# Patient Record
Sex: Female | Born: 2005 | Race: Black or African American | Hispanic: No
Health system: Southern US, Community
[De-identification: ages and names within clinical notes are randomized; demographics above are authoritative.]

## PROBLEM LIST (undated history)

## (undated) DIAGNOSIS — R5600 Simple febrile convulsions: Secondary | ICD-10-CM

## (undated) HISTORY — DX: Simple febrile convulsions: R56.00

---
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
## 2023-01-03 DIAGNOSIS — Z113 Encounter for screening for infections with a predominantly sexual mode of transmission: Secondary | ICD-10-CM | POA: Diagnosis not present

## 2023-01-10 DIAGNOSIS — A5400 Gonococcal infection of lower genitourinary tract, unspecified: Secondary | ICD-10-CM | POA: Diagnosis not present

## 2023-03-24 ENCOUNTER — Encounter (HOSPITAL_COMMUNITY): Payer: Self-pay | Admitting: Emergency Medicine

## 2023-03-24 ENCOUNTER — Emergency Department (HOSPITAL_COMMUNITY)
Admission: EM | Admit: 2023-03-24 | Discharge: 2023-03-25 | Disposition: A | Discharge status: Home / Self Care | Attending: Emergency Medicine | Admitting: Emergency Medicine

## 2023-03-24 DIAGNOSIS — R112 Nausea with vomiting, unspecified: Secondary | ICD-10-CM | POA: Diagnosis not present

## 2023-03-24 DIAGNOSIS — R197 Diarrhea, unspecified: Secondary | ICD-10-CM | POA: Insufficient documentation

## 2023-03-24 DIAGNOSIS — R1030 Lower abdominal pain, unspecified: Secondary | ICD-10-CM | POA: Diagnosis not present

## 2023-03-24 LAB — COMPREHENSIVE METABOLIC PANEL
ALT: 18 U/L (ref 0–44)
AST: 20 U/L (ref 15–41)
Albumin: 4.1 g/dL (ref 3.5–5.0)
Alkaline Phosphatase: 44 U/L (ref 38–126)
Anion gap: 10 (ref 5–15)
BUN: 10 mg/dL (ref 6–20)
CO2: 23 mmol/L (ref 22–32)
Calcium: 9.3 mg/dL (ref 8.9–10.3)
Chloride: 106 mmol/L (ref 98–111)
Creatinine, Ser: 0.9 mg/dL (ref 0.44–1.00)
GFR, Estimated: >60 mL/min (ref >60)
Glucose, Bld: 111 mg/dL — ABNORMAL HIGH (ref 70–99)
Potassium: 3.9 mmol/L (ref 3.5–5.1)
Sodium: 139 mmol/L (ref 135–145)
Total Bilirubin: 1 mg/dL (ref 0.0–1.2)
Total Protein: 7.4 g/dL (ref 6.5–8.1)

## 2023-03-24 LAB — CBC
HCT: 43 % (ref 36.0–46.0)
Hemoglobin: 14.2 g/dL (ref 12.0–15.0)
MCH: 29.3 pg (ref 26.0–34.0)
MCHC: 33 g/dL (ref 30.0–36.0)
MCV: 88.7 fL (ref 80.0–100.0)
Platelets: 204 10*3/uL (ref 150–400)
RBC: 4.85 MIL/uL (ref 3.87–5.11)
RDW: 12.9 % (ref 11.5–15.5)
WBC: 8.5 10*3/uL (ref 4.0–10.5)
nRBC: 0 % (ref 0.0–0.2)

## 2023-03-24 MED ORDER — ONDANSETRON 4 MG PO TBDP
4.0000 mg | ORAL_TABLET | Freq: Once | ORAL | Status: AC
  Administered 2023-03-24: 4 mg via ORAL
  Filled 2023-03-24: qty 1

## 2023-03-24 NOTE — ED Triage Notes (Signed)
 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

## 2023-03-25 LAB — URINALYSIS, ROUTINE W REFLEX MICROSCOPIC
Bilirubin Urine: NEGATIVE
Glucose, UA: NEGATIVE mg/dL
Hgb urine dipstick: NEGATIVE
Ketones, ur: 40 mg/dL — AB
Leukocytes,Ua: NEGATIVE
Nitrite: NEGATIVE
Protein, ur: NEGATIVE mg/dL
Specific Gravity, Urine: >1.03 — ABNORMAL HIGH (ref 1.005–1.030)
pH: 5.5 (ref 5.0–8.0)

## 2023-03-25 LAB — PREGNANCY, URINE: Preg Test, Ur: NEGATIVE

## 2023-03-25 LAB — LIPASE, BLOOD: Lipase: 25 U/L (ref 11–51)

## 2023-03-25 MED ORDER — LACTATED RINGERS IV BOLUS
1000.0000 mL | Freq: Once | INTRAVENOUS | Status: AC
  Administered 2023-03-25: 1000 mL via INTRAVENOUS

## 2023-03-25 MED ORDER — ONDANSETRON HCL 4 MG/2ML IJ SOLN
4.0000 mg | Freq: Once | INTRAMUSCULAR | Status: AC
  Administered 2023-03-25: 4 mg via INTRAVENOUS
  Filled 2023-03-25: qty 2

## 2023-03-25 MED ORDER — ONDANSETRON 4 MG PO TBDP: 4.0000 mg | ORAL_TABLET | Freq: Three times a day (TID) | ORAL | 0 refills | Status: DC | PRN

## 2023-03-25 NOTE — Discharge Instructions (Signed)
 Start with a clear liquid diet and advance slowly as tolerated.  Follow-up with your doctor.  Return to the ED for worsening abdominal pain, vomiting, not able to eat, not able to drink or other concerns

## 2023-03-25 NOTE — ED Provider Notes (Signed)
 [place] EMERGENCY DEPARTMENT AT [facility] Provider Note   CSN: [id number] Arrival date & time: 03/24/23  2243     History  Chief Complaint  Patient presents with   Emesis   Diarrhea    Faith Rios is a 18 y.o. female.  Patient with no past medical history here with abdominal pain, vomiting and diarrhea.  Symptoms onset about 5 PM the evening of March 2.  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  She reports about 4 episodes of yellowish vomit and yellowish stool.  Denies black or bloody stools.  Has lower abdominal pain at home but none currently.  No known fever.  No pain with urination or blood in the urine.  No vaginal bleeding or discharge.  No previous abdominal surgeries.  Has had sick contacts at work. States she has not had anything to eat or drink today at all even before prior to feeling ill at 5 PM.  Before 5 PM she felt relatively well but did not have any eat or drink today.  The history is provided by the patient.  Emesis Associated symptoms: abdominal pain and diarrhea   Associated symptoms: no arthralgias, no cough, no fever, no headaches and no myalgias   Diarrhea Associated symptoms: abdominal pain and vomiting   Associated symptoms: no arthralgias, no fever, no headaches and no myalgias        Home Medications Prior to Admission medications   Medication Sig Start Date End Date Taking? Authorizing Provider  ibuprofen (ADVIL) 200 MG tablet Take 200 mg by mouth every 6 (six) hours as needed for moderate pain.    [provider]  loratadine-pseudoephedrine (CLARITIN-D 12 HOUR) 5-120 MG tablet Take 1 tablet by mouth 2 (two) times daily. Patient not taking: Reported on 03/20/2021 11/17/18   Eustace Moore, MD  Vitamin D, Ergocalciferol, (DRISDOL) 50000 units CAPS capsule Take 1 capsule (50,000 Units total) by mouth every 7 (seven) days. Patient not taking: Reported on 03/20/2021 03/18/17   Annell Greening, MD       Allergies    Patient has no known allergies.    Review of Systems   Review of Systems  Constitutional:  Negative for activity change, appetite change and fever.  HENT:  Negative for congestion and rhinorrhea.   Respiratory:  Negative for cough, chest tightness and shortness of breath.   Cardiovascular:  Negative for chest pain.  Gastrointestinal:  Positive for abdominal pain, diarrhea and vomiting.  Genitourinary:  Negative for dysuria and hematuria.  Musculoskeletal:  Negative for arthralgias and myalgias.  Neurological:  Negative for dizziness, weakness and headaches.   all other systems are negative except as noted in the HPI and PMH.    Physical Exam Updated Vital Signs BP 131/80 (BP Location: Right Arm)   Pulse 77   Temp 98.2 F (36.8 C)   Resp 16   SpO2 100%  Physical Exam Vitals and nursing note reviewed.  Constitutional:      General: She is not in acute distress.    Appearance: She is well-developed.  HENT:     Head: Normocephalic and atraumatic.     Mouth/Throat:     Pharynx: No oropharyngeal exudate.  Eyes:     Conjunctiva/sclera: Conjunctivae normal.     Pupils: Pupils are equal, round, and reactive to light.  Neck:     Comments: No meningismus. Cardiovascular:     Rate and Rhythm: Normal rate and regular rhythm.     Heart  sounds: Normal heart sounds. No murmur heard. Pulmonary:     Effort: Pulmonary effort is normal. No respiratory distress.     Breath sounds: Normal breath sounds.  Abdominal:     Palpations: Abdomen is soft.     Tenderness: There is abdominal tenderness. There is no guarding or rebound.     Comments: Mild lower abdominal tenderness  Musculoskeletal:        General: No tenderness. Normal range of motion.     Cervical back: Normal range of motion and neck supple.  Skin:    General: Skin is warm.  Neurological:     Mental Status: She is alert and oriented to person, place, and time.     Cranial Nerves: No cranial nerve deficit.      Motor: No abnormal muscle tone.     Coordination: Coordination normal.     Comments:  5/5 strength throughout. CN 2-12 intact.Equal grip strength.   Psychiatric:        Behavior: Behavior normal.     ED Results / Procedures / Treatments   Labs (all labs ordered are listed, but only abnormal results are displayed) Labs Reviewed  COMPREHENSIVE METABOLIC PANEL - Abnormal; Notable for the following components:      Result Value   Glucose, Bld 111 (*)    All other components within normal limits  URINALYSIS, ROUTINE W REFLEX MICROSCOPIC - Abnormal; Notable for the following components:   Specific Gravity, Urine >1.030 (*)    Ketones, ur 40 (*)    All other components within normal limits  CBC  LIPASE, BLOOD  PREGNANCY, URINE  HCG, SERUM, QUALITATIVE    EKG None  Radiology No results found.  Procedures Procedures    Medications Ordered in ED Medications  lactated ringers bolus 1,000 mL (has no administration in time range)  ondansetron (ZOFRAN) injection 4 mg (has no administration in time range)  ondansetron (ZOFRAN-ODT) disintegrating tablet 4 mg (4 mg Oral Given 03/24/23 2307)    ED Course/ Medical Decision Making/ A&P                                 Medical Decision Making Amount and/or Complexity of Data Reviewed Labs: ordered. Decision-making details documented in ED Course. Radiology: ordered and independent interpretation performed. Decision-making details documented in ED Course. ECG/medicine tests: ordered and independent interpretation performed. Decision-making details documented in ED Course.  Risk Prescription drug management.   Nausea, vomiting, diarrhea, lower abdominal pain.  Multiple sick contacts at work.  Stable vitals.  Abdomen soft without peritoneal signs.  Will hydrate.  Low suspicion for surgical pathology such as appendicitis or cholecystitis.  hCG negative.  Urinalysis with ketones but negative for infection.  Labs are reassuring with  no significant leukocytosis.  No electrode abnormalities.  LFTs and lipase are normal.  Creatinine is normal  Suspect likely viral gastroenteritis.  Has had multiple sick contacts at work.  Abdomen is soft without peritoneal signs.  CT imaging offered but she declines at this time.  Low suspicion for appendicitis or cholecystitis.  Feels improved as her IV fluids and antiemetics.  Tolerating p.o.  Advised to start with clear liquid diet advance slowly as tolerated.  Will give course of antiemetics for home use. Abdomen remains soft without peritoneal signs and she continues to decline CT scan.  Follow with PCP.  Return to the ED with new or worsening symptoms.  Final Clinical Impression(s) / ED Diagnoses Final diagnoses:  None    Rx / DC Orders ED Discharge Orders     None         Erle Guster, Jeannett Senior, MD 03/25/23 [phone]

## 2023-07-19 DIAGNOSIS — S71122A Laceration with foreign body, left thigh, initial encounter: Secondary | ICD-10-CM | POA: Insufficient documentation

## 2023-07-19 DIAGNOSIS — Y92410 Unspecified street and highway as the place of occurrence of the external cause: Secondary | ICD-10-CM | POA: Diagnosis not present

## 2023-07-19 DIAGNOSIS — Z23 Encounter for immunization: Secondary | ICD-10-CM | POA: Diagnosis not present

## 2023-07-19 DIAGNOSIS — S70352A Superficial foreign body, left thigh, initial encounter: Secondary | ICD-10-CM | POA: Diagnosis not present

## 2023-07-20 ENCOUNTER — Emergency Department (HOSPITAL_COMMUNITY)

## 2023-07-20 ENCOUNTER — Other Ambulatory Visit: Payer: Self-pay

## 2023-07-20 ENCOUNTER — Emergency Department (HOSPITAL_COMMUNITY)
Admission: EM | Admit: 2023-07-20 | Discharge: 2023-07-20 | Disposition: A | Discharge status: Home / Self Care | Attending: Emergency Medicine | Admitting: Emergency Medicine

## 2023-07-20 ENCOUNTER — Encounter (HOSPITAL_COMMUNITY): Payer: Self-pay | Admitting: *Deleted

## 2023-07-20 DIAGNOSIS — S71112A Laceration without foreign body, left thigh, initial encounter: Secondary | ICD-10-CM

## 2023-07-20 DIAGNOSIS — S70352A Superficial foreign body, left thigh, initial encounter: Secondary | ICD-10-CM | POA: Diagnosis not present

## 2023-07-20 MED ORDER — LIDOCAINE-EPINEPHRINE-TETRACAINE (LET) TOPICAL GEL
3.0000 mL | Freq: Once | TOPICAL | Status: AC
  Administered 2023-07-20: 3 mL via TOPICAL
  Filled 2023-07-20: qty 3

## 2023-07-20 MED ORDER — TETANUS-DIPHTH-ACELL PERTUSSIS 5-2.5-18.5 LF-MCG/0.5 IM SUSY
0.5000 mL | PREFILLED_SYRINGE | Freq: Once | INTRAMUSCULAR | Status: AC
  Administered 2023-07-20: 0.5 mL via INTRAMUSCULAR
  Filled 2023-07-20: qty 0.5

## 2023-07-20 NOTE — ED Provider Notes (Signed)
.  Foreign Body Removal  Date/Time: 07/20/2023 10:14 AM  Performed by: Myriam Dorn BROCKS, PA Authorized by: Myriam Dorn BROCKS, PA  Consent: Verbal consent obtained Risks and benefits: risks, benefits and alternatives were discussed Consent given by: patient Patient understanding: patient states understanding of the procedure being performed Imaging studies: imaging studies available Patient identity confirmed: verbally with patient, arm band, provided demographic data and hospital-assigned identification number Body area: skin General location: lower extremity Location details: left hip  Anesthesia: Local Anesthetic: LET (lido,epi,tetracaine)  Sedation: Patient sedated: no  Patient restrained: no Patient cooperative: yes Localization method: serial x-rays Removal mechanism: forceps Dressing: antibiotic ointment and dressing applied Tendon involvement: none Depth: subcutaneous Complexity: simple 1 objects recovered. Objects recovered: Small wedge-shaped piece of glass from superficial skin. Post-procedure assessment: foreign body removed      Myriam Dorn BROCKS, PA 07/20/23 1017    Elnor Savant A, DO 07/21/23 [phone]

## 2023-07-20 NOTE — Discharge Instructions (Signed)
 It was a pleasure caring for you today in the emergency department.  Please keep wound clean and dry, if you develop pain, redness, swelling, drainage from the wound, fever, or any worsening or worrisome symptoms please come back to the ER. The dermabond/tissue adhesive should fall off in around 2 wks.
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
## 2023-08-21 ENCOUNTER — Other Ambulatory Visit: Payer: Self-pay

## 2023-08-21 ENCOUNTER — Encounter (HOSPITAL_COMMUNITY): Payer: Self-pay | Admitting: Obstetrics and Gynecology

## 2023-08-21 ENCOUNTER — Inpatient Hospital Stay (HOSPITAL_COMMUNITY)
Admission: AD | Admit: 2023-08-21 | Discharge: 2023-08-21 | Disposition: A | Discharge status: Home / Self Care | Attending: Obstetrics and Gynecology | Admitting: Obstetrics and Gynecology

## 2023-08-21 DIAGNOSIS — N926 Irregular menstruation, unspecified: Secondary | ICD-10-CM

## 2023-08-21 DIAGNOSIS — N912 Amenorrhea, unspecified: Secondary | ICD-10-CM | POA: Diagnosis present

## 2023-08-21 NOTE — MAU Note (Signed)
 Faith Rios is a 18 y.o. at Unknown here in MAU reporting: she missed her cycle and wants a pregnancy test.  Reports hasn't taken a HPT.  Denies current VB and pain  LMP: beginning of June Onset of complaint: today Pain score: 0 Vitals:   08/21/23 1310  BP: (!) 129/54  Pulse: 79  Resp: 19  Temp: 98.8 F (37.1 C)  SpO2: 99%     FHT: NA  Lab orders placed from triage: None

## 2023-08-21 NOTE — MAU Provider Note (Signed)
 Chief Complaint: Possible Pregnancy  SUBJECTIVE HPI: Faith Rios is a 18 y.o. G0P0 here for pregnancy test.  Patient missed her period this month. Unsure what day her last was except for it was in June. Not on anything for contraception. Has not taken a home pregnancy test. She denies vaginal bleeding, vaginal itching/burning, urinary symptoms, h/a, dizziness, n/v, abdominal pain, or fever/chills.    HPI  Past Medical History:  Diagnosis Date   Febrile seizure (HCC)    History reviewed. No pertinent surgical history. Social History   Socioeconomic History   Marital status: Unknown    Spouse name: Not on file   Number of children: Not on file   Years of education: Not on file   Highest education level: Not on file  Occupational History   Not on file  Tobacco Use   Smoking status: Never    Passive exposure: Never   Smokeless tobacco: Never  Substance and Sexual Activity   Alcohol use: Never   Drug use: Never   Sexual activity: Never  Other Topics Concern   Not on file  Social History Narrative   ** Merged History Encounter **       Lives with Mom and brother.  No pets  No tobacco.     Social Drivers of Corporate investment banker Strain: Not on file  Food Insecurity: Not on file  Transportation Needs: Not on file  Physical Activity: Not on file  Stress: Not on file  Social Connections: Not on file  Intimate Partner Violence: Not on file   No current facility-administered medications on file prior to encounter.   No current outpatient medications on file prior to encounter.   No Known Allergies  ROS:  Pertinent positives/negatives listed above.  I have reviewed patient's Past Medical Hx, Surgical Hx, Family Hx, Social Hx, medications and allergies.   Physical Exam  Patient Vitals for the past 24 hrs:  BP Temp Temp src Pulse Resp SpO2 Height Weight  08/21/23 1310 (!) 129/54 98.8 F (37.1 C) Oral 79 19 99 % -- --  08/21/23 1305 -- -- -- -- -- -- 5' 8  (1.727 m) 85.2 kg   Constitutional: Well-developed, well-nourished female in no acute distress.  Cardiovascular: normal rate Respiratory: normal effort GI: Abd soft, non-tender MS: Extremities nontender, no edema, normal ROM Neurologic: Alert and oriented x 4  LAB RESULTS No results found for this or any previous visit (from the past 24 hours).     IMAGING No results found.  MAU Management/MDM: Orders Placed This Encounter  Procedures   Discharge patient    No orders of the defined types were placed in this encounter.   Patient presents for pregnancy test. As she is having no red flag symptoms for pregnancy complication, advised her that this is an emergency department, and that she should go to the health department, clinic, or obtain a pregnancy test from the store per hospital policy. Discharged in stable condition.  ASSESSMENT 1. Missed period     PLAN Discharge home with strict return precautions. Allergies as of 08/21/2023   No Known Allergies      Medication List     STOP taking these medications    Claritin -D 12 Hour 5-120 MG tablet Generic drug: loratadine -pseudoephedrine   ibuprofen 200 MG tablet Commonly known as: ADVIL   ondansetron  4 MG disintegrating tablet Commonly known as: ZOFRAN -ODT   Vitamin D  (Ergocalciferol ) 1.25 MG (50000 UNIT) Caps capsule Commonly known as: DRISDOL   Almarie Moats, MD OB Fellow 08/21/2023  1:19 PM

## 2023-08-21 NOTE — Progress Notes (Signed)
 Dr. Nicholaus into see pt and discuss POC.
